# Patient Record
Sex: Female | Born: 1980 | Race: Black or African American | Hispanic: No | Marital: Single | Smoking: Current every day smoker
Health system: Southern US, Community
[De-identification: ages and names within clinical notes are randomized; demographics above are authoritative.]

## PROBLEM LIST (undated history)

## (undated) DIAGNOSIS — I10 Essential (primary) hypertension: Secondary | ICD-10-CM

## (undated) HISTORY — PX: WISDOM TOOTH EXTRACTION: SHX21

## (undated) HISTORY — PX: HAND SURGERY: SHX662

---

## 2003-12-29 ENCOUNTER — Emergency Department (HOSPITAL_COMMUNITY): Admission: EM | Admit: 2003-12-29 | Discharge: 2003-12-29 | Payer: Self-pay | Admitting: Family Medicine

## 2003-12-29 ENCOUNTER — Emergency Department (HOSPITAL_COMMUNITY): Admission: EM | Admit: 2003-12-29 | Discharge: 2003-12-29 | Payer: Self-pay | Admitting: Advanced Practice Midwife

## 2016-06-24 ENCOUNTER — Encounter: Payer: Self-pay | Admitting: Emergency Medicine

## 2016-06-24 ENCOUNTER — Emergency Department
Admission: EM | Admit: 2016-06-24 | Discharge: 2016-06-24 | Disposition: A | Discharge status: Home / Self Care | Payer: Self-pay | Attending: Emergency Medicine | Admitting: Emergency Medicine

## 2016-06-24 DIAGNOSIS — F17210 Nicotine dependence, cigarettes, uncomplicated: Secondary | ICD-10-CM | POA: Insufficient documentation

## 2016-06-24 DIAGNOSIS — K0889 Other specified disorders of teeth and supporting structures: Secondary | ICD-10-CM | POA: Insufficient documentation

## 2016-06-24 MED ORDER — MAGIC MOUTHWASH W/LIDOCAINE: 5.0000 mL | Freq: Four times a day (QID) | ORAL | 0 refills | Status: DC

## 2016-06-24 NOTE — Discharge Instructions (Signed)
OPTIONS FOR DENTAL FOLLOW UP CARE ° °[place] Department of Health and Human Services - Local Safety Net Dental Clinics °http://www.ncdhhs.gov/dph/oralhealth/services/safetynetclinics.htm °  °[facility] (336-562-3123) ° °[facility] ([phone]) ° °[facility] ([phone]) ° °[facility] ([phone]) ° °[facility] ([phone]) °This clinic caters to the indigent population and is on a lottery system. °Location: °[facility], [address], [place] °Clinic Hours: °Wednesdays from 6pm - 9pm, patients seen by a lottery system. °For dates, call or go to www.med.[facility].edu/[facility]ac/patients/Dental-[facility] °Services: °Cleanings, fillings and simple extractions. °Payment Options: °DENTAL WORK IS FREE OF CHARGE. Bring proof of income or support. °Best way to get seen: °Arrive at 5:15 pm - this is a lottery, NOT first come/first serve, so arriving earlier will not increase your chances of being seen. °  °  °[facility] °[phone] °Select option 1 for emergencies °  °Location: °[facility], [address], [place] °Clinic Hours: °No walk-ins accepted - call the day before to schedule an appointment. °Check in times are 9:30 am and 1:30 pm. °Services: °Simple extractions, temporary fillings, pulpectomy/pulp debridement, uncomplicated abscess drainage. °Payment Options: °PAYMENT IS DUE AT THE TIME OF SERVICE.  Fee is usually $100-200, additional surgical procedures (e.g. abscess drainage) may be extra. °Cash, checks, Visa/MasterCard accepted.  Can file Medicaid if patient is covered for dental - patient should call case worker to check. °No discount for [facility] patients. °Best way to get seen: °MUST call the day before and get onto the schedule. Can usually be seen the next 1-2 days. No walk-ins accepted. °  °  °[facility] °[phone] °   °Location: °[facility], [address], [place] °Clinic Hours: °M, W, Th, F 8am or 1:30pm, Tues 9a or 1:30 - first come/first served. °Services: °Simple extractions, temporary fillings, uncomplicated abscess drainage.  You do not need to be an [place] resident. °Payment Options: °PAYMENT IS DUE AT THE TIME OF SERVICE. °Dental insurance, otherwise sliding scale - bring proof of income or support. °Depending on income and treatment needed, cost is usually $50-200. °Best way to get seen: °Arrive early as it is first come/first served. °  °  °[facility] °[phone] °  °Location: °[address] °Clinic Hours: °Mon-Thu 8a-5p °Services: °Most basic dental services including extractions and fillings. °Payment Options: °PAYMENT IS DUE AT THE TIME OF SERVICE. °Sliding scale, up to 50% off - bring proof if income or support. °Medicaid with dental option accepted. °Best way to get seen: °Call to schedule an appointment, can usually be seen within 2 weeks OR they will try to see walk-ins - show up at 8a or 2p (you may have to wait). °  °  °[facility] °[phone] °[place] RESIDENTS ONLY °  °Location: °[facility], [address], [place], [place] [address] °Clinic Hours: By appointment only. °Monday - Thursday 8am-5pm, Friday 8am-12pm °Services: Cleanings, fillings, extractions. °Payment Options: °PAYMENT IS DUE AT THE TIME OF SERVICE. °Cash, Visa or MasterCard. Sliding scale - $30 minimum per service. °Best way to get seen: °Come in to office, complete packet and make an appointment - need proof of income °or support monies for each household member and proof of [place] residence. °Usually takes about a month to get in. °  °  °[facility] °[phone] °  °Location: °[address]   [place] °Clinic Hours: Walk-in Urgent Care Dental Services are offered Monday-Friday  mornings only. °The numbers of emergencies accepted daily is limited to the number of °providers available. °Maximum 15 - Mondays, Wednesdays & Thursdays °Maximum 10 - Tuesdays & Fridays °Services: °You do not need to be a [place] resident to be seen for a dental emergency. °Emergencies are defined as pain, swelling, abnormal bleeding, or dental trauma. Walkins will receive x-rays if needed. °NOTE: Dental cleaning is not an emergency. °Payment Options: °PAYMENT IS DUE AT THE TIME OF SERVICE. °Minimum co-pay is $40.00 for uninsured patients. °Minimum co-pay is $3.00 for Medicaid with dental coverage. °Dental Insurance is accepted and must be presented at time of visit. °Medicare does not cover dental. °Forms of payment: Cash, credit card, checks. °Best way to get seen: °If not previously registered with the clinic, walk-in dental registration begins at 7:15 am and is on a first come/first serve basis. °If previously registered with the clinic, call to make an appointment. °  °  °[facility] °[phone] °[place] RESIDENTS ONLY °  °Location: °[address], [place] °Clinic Hours: °Mon-Thu 10a-2p °Services: Extractions only! °Payment Options: °FREE (donations accepted) - bring proof of income or support °Best way to get seen: °Call and schedule an appointment OR come at 8am on the 1st Monday of every month (except for holidays) when it is first come/first served. °  °  °[facility] °[phone] °  °Location: °[address], [place] °Clinic Hours: °Friday mornings °Services, Payment Options, Best way to get seen: °Call for info °

## 2016-06-24 NOTE — ED Provider Notes (Signed)
[facility] Emergency Department Provider Note  ____________________________________________  Time seen: Approximately 8:30 PM  I have reviewed the triage vital signs and the nursing notes.   HISTORY  Chief Complaint Dental Pain    HPI Linda Newman is a 36 y.o. female presents emergency department complaining of dental pain to the second molar right lower dentition. Patient denies any trauma to the area. She denies any swelling, fevers or chills, difficulty breathing or swallowing. Patient reports that she has had cavities and that tooth with repair. No other complaints at this time. No medications prior to arrival.   History reviewed. No pertinent past medical history.  There are no active problems to display for this patient.   Past Surgical History:  Procedure Laterality Date  . HAND SURGERY Right   . WISDOM TOOTH EXTRACTION Bilateral     Prior to Admission medications   Medication Sig Start Date End Date Taking? Authorizing Provider  magic mouthwash w/lidocaine SOLN Take 5 mLs by mouth 4 (four) times daily. 06/24/16   Cuthriell, Delorise Royals, PA-C    Allergies Sulfa antibiotics  History reviewed. No pertinent family history.  Social History Social History  Substance Use Topics  . Smoking status: Current Every Day Smoker    Packs/day: 0.50    Types: Cigarettes  . Smokeless tobacco: Never Used  . Alcohol use No     Review of Systems  Constitutional: No fever/chills Eyes: No visual changes. No discharge ENT: No upper respiratory complaints.Positive for right lower dental pain Cardiovascular: no chest pain. Respiratory: no cough. No SOB. Gastrointestinal: No abdominal pain.  No nausea, no vomiting.   Musculoskeletal: Negative for musculoskeletal pain. Skin: Negative for rash, abrasions, lacerations, ecchymosis. Neurological: Negative for headaches, focal weakness or numbness. 10-point ROS otherwise  negative.  ____________________________________________   PHYSICAL EXAM:  VITAL SIGNS: ED Triage Vitals  Enc Vitals Group     BP 06/24/16 2027 112/69     Pulse Rate 06/24/16 2027 85     Resp 06/24/16 2027 16     Temp 06/24/16 2027 98.1 F (36.7 C)     Temp Source 06/24/16 2027 Oral     SpO2 06/24/16 2027 100 %     Weight 06/24/16 2027 134 lb (60.8 kg)     Height 06/24/16 2027 4\' 10"  (1.473 m)     Head Circumference --      Peak Flow --      Pain Score 06/24/16 2028 5     Pain Loc --      Pain Edu? --      Excl. in GC? --      Constitutional: Alert and oriented. Well appearing and in no acute distress. Eyes: Conjunctivae are normal. PERRL. EOMI. Head: Atraumatic. ENT:      Ears:       Nose: No congestion/rhinnorhea.      Mouth/Throat: Mucous membranes are moist. Cavity repair noted to the second molar right lower dentition. No surrounding erythema or edema. No tenderness to palpation of any tooth with tongue depressor. Uvula is midline. Tonsils are unremarkable bilaterally. Neck: No stridor.   Hematological/Lymphatic/Immunilogical: No cervical lymphadenopathy. Cardiovascular: Normal rate, regular rhythm. Normal S1 and S2.  Good peripheral circulation. Respiratory: Normal respiratory effort without tachypnea or retractions. Lungs CTAB. Good air entry to the bases with no decreased or absent breath sounds. Musculoskeletal: Full range of motion to all extremities. No gross deformities appreciated. Neurologic:  Normal speech and language. No gross focal neurologic deficits are appreciated.  Skin:  Skin is warm, dry and intact. No rash noted. Psychiatric: Mood and affect are normal. Speech and behavior are normal. Patient exhibits appropriate insight and judgement.   ____________________________________________   LABS (all labs ordered are listed, but only abnormal results are displayed)  Labs Reviewed - No data to  display ____________________________________________  EKG   ____________________________________________  RADIOLOGY   No results found.  ____________________________________________    PROCEDURES  Procedure(s) performed:    Procedures    Medications - No data to display   ____________________________________________   INITIAL IMPRESSION / ASSESSMENT AND PLAN / ED COURSE  Pertinent labs & imaging results that were available during my care of the patient were reviewed by me and considered in my medical decision making (see chart for details).  Review of the [place] CSRS was performed in accordance of the [facility] prior to dispensing any controlled drugs.     Patient's diagnosis is consistent with dental pain. Exam reveals no signs of infection. No visible trauma. No loose dentition.. Patient will be discharged home with prescriptions for Magic mouthwash for symptom control. The patient is given dental care options to follow-up with for further evaluation should pain continue.. Patient is to follow up with primary care as needed or otherwise directed. Patient is given ED precautions to return to the ED for any worsening or new symptoms.     ____________________________________________  FINAL CLINICAL IMPRESSION(S) / ED DIAGNOSES  Final diagnoses:  Pain, dental      NEW MEDICATIONS STARTED DURING THIS VISIT:  New Prescriptions   MAGIC MOUTHWASH W/LIDOCAINE SOLN    Take 5 mLs by mouth 4 (four) times daily.        This chart was dictated using voice recognition software/Dragon. Despite best efforts to proofread, errors can occur which can change the meaning. Any change was purely unintentional.    Racheal PatchesCuthriell, Jonathan D, PA-C 06/24/16 2048    Sharman CheekStafford, Phillip, MD 06/26/16 [phone]

## 2016-06-24 NOTE — ED Triage Notes (Signed)
Pt to ED from home c/o right tooth pain x2 days.  States trouble chewing on that side and cold drink makes it worse.

## 2017-02-06 ENCOUNTER — Telehealth: Payer: Self-pay | Admitting: Adult Health Nurse Practitioner

## 2017-02-06 NOTE — Telephone Encounter (Signed)
Wants dental services

## 2017-03-18 ENCOUNTER — Emergency Department
Admission: EM | Admit: 2017-03-18 | Discharge: 2017-03-18 | Disposition: A | Discharge status: Home / Self Care | Payer: Self-pay | Attending: Emergency Medicine | Admitting: Emergency Medicine

## 2017-03-18 ENCOUNTER — Encounter: Payer: Self-pay | Admitting: Emergency Medicine

## 2017-03-18 DIAGNOSIS — Z79899 Other long term (current) drug therapy: Secondary | ICD-10-CM | POA: Insufficient documentation

## 2017-03-18 DIAGNOSIS — F17210 Nicotine dependence, cigarettes, uncomplicated: Secondary | ICD-10-CM | POA: Insufficient documentation

## 2017-03-18 DIAGNOSIS — L02411 Cutaneous abscess of right axilla: Secondary | ICD-10-CM | POA: Insufficient documentation

## 2017-03-18 MED ORDER — TRAMADOL HCL 50 MG PO TABS: 50.0000 mg | ORAL_TABLET | Freq: Two times a day (BID) | ORAL | 0 refills | Status: DC

## 2017-03-18 MED ORDER — LIDOCAINE HCL (PF) 1 % IJ SOLN
5.0000 mL | Freq: Once | INTRAMUSCULAR | Status: AC
  Administered 2017-03-18: 5 mL
  Filled 2017-03-18: qty 5

## 2017-03-18 MED ORDER — DOXYCYCLINE HYCLATE 100 MG PO TABS: 100.0000 mg | ORAL_TABLET | Freq: Two times a day (BID) | ORAL | 0 refills | Status: DC

## 2017-03-18 MED ORDER — LIDOCAINE HCL (PF) 1 % IJ SOLN
INTRAMUSCULAR | Status: AC
  Administered 2017-03-18: 5 mL
  Filled 2017-03-18: qty 5

## 2017-03-18 MED ORDER — DOXYCYCLINE HYCLATE 100 MG PO TABS
100.0000 mg | ORAL_TABLET | Freq: Once | ORAL | Status: AC
  Administered 2017-03-18: 100 mg via ORAL
  Filled 2017-03-18: qty 1

## 2017-03-18 NOTE — ED Notes (Signed)
Pt given gown and ask to remove clothing from waist up.

## 2017-03-18 NOTE — ED Triage Notes (Signed)
Pt comes into the ED via POV c/o abscess to the right axilla.  Patient in NAD at this time and states the abscess has been there for a week.

## 2017-03-18 NOTE — ED Provider Notes (Signed)
[facility] Emergency Department Provider Note ____________________________________________  Time seen: 1729  I have reviewed the triage vital signs and the nursing notes.  HISTORY  Chief Complaint  Abscess  HPI Linda Newman is a 37 y.o. female presents to the ED for evaluation of a painful abscess to the right axilla.  Patient describes his abscess has been present for the last week.  She has increased pain, tenderness, and swelling to the area.  She denies any spontaneous drainage.  She is not taking any medications in the interim.  She reports a history of recurrent abscess formation to the armpit. She notes most abscesses resolve without intervention.   History reviewed. No pertinent past medical history.  There are no active problems to display for this patient.   Past Surgical History:  Procedure Laterality Date  . HAND SURGERY Right   . WISDOM TOOTH EXTRACTION Bilateral     Prior to Admission medications   Medication Sig Start Date End Date Taking? Authorizing Provider  doxycycline (VIBRA-TABS) 100 MG tablet Take 1 tablet (100 mg total) by mouth 2 (two) times daily. 03/18/17   Percilla Tweten, Charlesetta IvoryJenise V Bacon, PA-C  magic mouthwash w/lidocaine SOLN Take 5 mLs by mouth 4 (four) times daily. 06/24/16   Cuthriell, Delorise RoyalsJonathan D, PA-C  traMADol (ULTRAM) 50 MG tablet Take 1 tablet (50 mg total) by mouth 2 (two) times daily. 03/18/17   Avantae Bither, Charlesetta IvoryJenise V Bacon, PA-C    Allergies Sulfa antibiotics  No family history on file.  Social History Social History   Tobacco Use  . Smoking status: Current Every Day Smoker    Packs/day: 0.50    Types: Cigarettes  . Smokeless tobacco: Never Used  Substance Use Topics  . Alcohol use: No  . Drug use: No    Review of Systems  Constitutional: Negative for fever. Cardiovascular: Negative for chest pain. Respiratory: Negative for shortness of breath. Musculoskeletal: Negative for back pain. Skin: Negative for rash.   Right armpit abscess as above. Neurological: Negative for headaches, focal weakness or numbness. ____________________________________________  PHYSICAL EXAM:  VITAL SIGNS: ED Triage Vitals [03/18/17 1652]  Enc Vitals Group     BP (!) 145/95     Pulse Rate 95     Resp 16     Temp 99.1 F (37.3 C)     Temp Source Oral     SpO2 99 %     Weight 131 lb (59.4 kg)     Height 4\' 10"  (1.473 m)     Head Circumference      Peak Flow      Pain Score 7     Pain Loc      Pain Edu?      Excl. in GC?    Constitutional: Alert and oriented. Well appearing and in no distress. Head: Normocephalic and atraumatic. Hematological/Lymphatic/Immunological: No cervical lymphadenopathy. Cardiovascular: Normal rate, regular rhythm. Normal distal pulses. Respiratory: Normal respiratory effort.  Musculoskeletal: Nontender with normal range of motion in all extremities.  Neurologic:  Normal gait without ataxia. Normal speech and language. No gross focal neurologic deficits are appreciated. Skin:  Skin is warm, dry and intact. No rash noted. ____________________________________________  PROCEDURES  Doxycycline 100 mg Po  .Marland Kitchen.Incision and Drainage Date/Time: 03/18/2017 6:56 PM Performed by: Collier FlowersNelson, Robert G, Student-PA Authorized by: Lissa HoardMenshew, Aryannah Mohon V Bacon, PA-C   Consent:    Consent obtained:  Verbal   Consent given by:  Patient   Risks discussed:  Bleeding, infection, incomplete drainage and pain  Alternatives discussed:  Alternative treatment, delayed treatment and observation Location:    Type:  Abscess   Location:  Upper extremity   Upper extremity location: axilla. Pre-procedure details:    Skin preparation:  Betadine Anesthesia (see MAR for exact dosages):    Anesthesia method:  Local infiltration   Local anesthetic:  Lidocaine 1% w/o epi Procedure type:    Complexity:  Complex Procedure details:    Incision types:  Single straight   Incision depth:  Subcutaneous   Scalpel blade:   11   Wound management:  Probed and deloculated and irrigated with saline   Drainage:  Bloody   Drainage amount:  Moderate   Wound treatment:  Drain placed   Packing materials:  1/4 in iodoform gauze Post-procedure details:    Patient tolerance of procedure:  Tolerated well, no immediate complications  ____________________________________________  INITIAL IMPRESSION / ASSESSMENT AND PLAN / ED COURSE  Patient with ED evaluation of a right axilla abscess and cellulitis. 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 She is discharged with a prescription for Doxycycline and Ultram. She is also given a work note for several days. She is to follow-up with [facility] for wound check.  ____________________________________________  FINAL CLINICAL IMPRESSION(S) / ED DIAGNOSES  Final diagnoses:  Abscess of axilla, right      Lissa Hoard, PA-C 03/18/17 Pershing Cox, MD 03/18/17 1921

## 2017-03-18 NOTE — Discharge Instructions (Signed)
Keep the wound clean, dry, and covered. Take the antibiotic as directed. Apply warm compresses over the dressing to promote healing.

## 2017-03-18 NOTE — ED Notes (Signed)
First RN note:  Patient here for a boil under the right arm.  Patient in NAD at this time.

## 2018-10-16 ENCOUNTER — Emergency Department
Admission: EM | Admit: 2018-10-16 | Discharge: 2018-10-16 | Disposition: A | Discharge status: Home / Self Care | Payer: Self-pay | Attending: Emergency Medicine | Admitting: Emergency Medicine

## 2018-10-16 ENCOUNTER — Encounter: Payer: Self-pay | Admitting: Emergency Medicine

## 2018-10-16 ENCOUNTER — Emergency Department: Payer: Self-pay

## 2018-10-16 ENCOUNTER — Other Ambulatory Visit: Payer: Self-pay

## 2018-10-16 DIAGNOSIS — I10 Essential (primary) hypertension: Secondary | ICD-10-CM | POA: Insufficient documentation

## 2018-10-16 DIAGNOSIS — K0381 Cracked tooth: Secondary | ICD-10-CM | POA: Insufficient documentation

## 2018-10-16 DIAGNOSIS — R519 Headache, unspecified: Secondary | ICD-10-CM | POA: Insufficient documentation

## 2018-10-16 DIAGNOSIS — R55 Syncope and collapse: Secondary | ICD-10-CM | POA: Insufficient documentation

## 2018-10-16 DIAGNOSIS — Y929 Unspecified place or not applicable: Secondary | ICD-10-CM | POA: Insufficient documentation

## 2018-10-16 DIAGNOSIS — Y999 Unspecified external cause status: Secondary | ICD-10-CM | POA: Insufficient documentation

## 2018-10-16 DIAGNOSIS — Z79899 Other long term (current) drug therapy: Secondary | ICD-10-CM | POA: Insufficient documentation

## 2018-10-16 DIAGNOSIS — Y9389 Activity, other specified: Secondary | ICD-10-CM | POA: Insufficient documentation

## 2018-10-16 DIAGNOSIS — F17210 Nicotine dependence, cigarettes, uncomplicated: Secondary | ICD-10-CM | POA: Insufficient documentation

## 2018-10-16 DIAGNOSIS — S0083XA Contusion of other part of head, initial encounter: Secondary | ICD-10-CM | POA: Insufficient documentation

## 2018-10-16 DIAGNOSIS — S00511A Abrasion of lip, initial encounter: Secondary | ICD-10-CM | POA: Insufficient documentation

## 2018-10-16 HISTORY — DX: Essential (primary) hypertension: I10

## 2018-10-16 MED ORDER — IBUPROFEN 600 MG PO TABS
600.0000 mg | ORAL_TABLET | Freq: Once | ORAL | Status: AC
  Administered 2018-10-16: 600 mg via ORAL
  Filled 2018-10-16: qty 1

## 2018-10-16 MED ORDER — ACETAMINOPHEN 500 MG PO TABS
1000.0000 mg | ORAL_TABLET | Freq: Once | ORAL | Status: AC
  Administered 2018-10-16: 1000 mg via ORAL
  Filled 2018-10-16: qty 2
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
## 2018-10-16 NOTE — Discharge Instructions (Addendum)
CT scans were reassuring.  I do not see any obvious severe fracture of your tooth.  You might have a small chip on the tip of the tooth.  I have given you dentist follow-up to use as needed.  You can take Tylenol 1 g every 8 hours and ibuprofen 600 every 8 hours to help with pain.    IMPRESSION: 1. No acute intracranial abnormality. 2. No acute facial fracture. Mild left malar and right mandibular soft tissue swelling. 3. 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 Correlate with dental exam. 4. No acute cervical spine fracture or traumatic listhesis.  OPTIONS FOR DENTAL FOLLOW UP CARE  [place] [facility] [place] [email or web address] [phone])  [facility] [phone])  [place] [phone])  [place] [phone])  [facility] [phone]) This clinic caters to the indigent population and is on a lottery system. Location: [employer] [facility], [place], [place], [place] Clinic Hours: Wednesdays from 6pm - 9pm, patients seen by a lottery system. For dates, call or go to [email or web address] Services: Cleanings, fillings and simple extractions. Payment Options: DENTAL WORK IS FREE OF CHARGE. Bring proof of income or support. Best way to get seen: Arrive at 5:15 pm - this is a lottery, NOT first come/first serve, so arriving earlier will not increase your chances of being seen.     [facility] [phone] Select option 1 for emergencies   Location: [facility] [place], [address] [place] Clinic Hours: No walk-ins accepted - call the day before to schedule an appointment. Check in times are 9:30 am and 1:30 pm. Services: Simple extractions,  temporary fillings, pulpectomy/pulp debridement, uncomplicated abscess drainage. Payment Options: PAYMENT IS DUE AT THE TIME OF SERVICE.  Fee is usually $100-200, additional surgical procedures (e.g. abscess drainage) may be extra. Cash, checks, Visa/MasterCard accepted.  Can file Medicaid if patient is covered for dental - patient should call case worker to check. No discount for [facility] patients. Best way to get seen: MUST call the day before and get onto the schedule. Can usually be seen the next 1-2 days. No walk-ins accepted.     [place] [phone]   Location: [place], [place] Clinic Hours: M, W, Th, F 8am or 1:30pm, Tues 9a or 1:30 - first come/first served. Services: Simple extractions, temporary fillings, uncomplicated abscess drainage.  You do not need to be an [facility] resident. Payment Options: PAYMENT IS DUE AT THE TIME OF SERVICE. Dental insurance, otherwise sliding scale - bring proof of income or support. Depending on income and treatment needed, cost is usually $50-200. Best way to get seen: Arrive early as it is first come/first served.     [facility] [phone]   Location: [place] [facility] Hours: Mon-Thu 8a-5p Services: Most basic dental services including extractions and fillings. Payment Options: PAYMENT IS DUE AT THE TIME OF SERVICE. Sliding scale, up to 50% off - bring proof if income or support. Medicaid with dental option accepted. Best way to get seen: Call to schedule an appointment, can usually be seen within 2 weeks OR they will try to see walk-ins - show up at [place] or 2p (you may have to wait).     [facility]  RESIDENTS ONLY   Location: [employer], [address] [place], [place] [address] Clinic Hours: By appointment only. Monday - Thursday 8am-5pm,  Friday 8am-12pm Services: Cleanings, fillings, extractions. Payment Options: PAYMENT IS DUE AT THE TIME OF SERVICE. Cash, Visa or MasterCard. Sliding scale - $30 minimum per service. Best way to get seen: Come in to office, complete packet and make an appointment - need proof of income or support monies for each household member and proof of [facility] residence. Usually takes about a month to get in.     [facility] [phone]   Location: [address] [facility] Hours: Walk-in Urgent Care Dental Services are offered Monday-Friday mornings only. The numbers of emergencies accepted daily is limited to the number of providers available. Maximum 15 - Mondays, Wednesdays & Thursdays Maximum 10 - Tuesdays & Fridays Services: You do not need to be a [facility] resident to be seen for a dental emergency. Emergencies are defined as pain, swelling, abnormal bleeding, or dental trauma. Walkins will receive x-rays if needed. NOTE: Dental cleaning is not an emergency. Payment Options: PAYMENT IS DUE AT THE TIME OF SERVICE. Minimum co-pay is $40.00 for uninsured patients. Minimum co-pay is $3.00 for Medicaid with dental coverage. Dental Insurance is accepted and must be presented at time of visit. Medicare does not cover dental. Forms of payment: Cash, credit card, checks. Best way to get seen: If not previously registered with the clinic, walk-in dental registration begins at 7:15 am and is on a first come/first serve basis. If previously registered with the clinic, call to make an appointment.     [facility] [phone] [place] RESIDENTS ONLY   Location: [address] [place], [place] Clinic Hours: Mon-Thu 10a-2p Services: Extractions only! Payment Options: FREE (donations accepted) - bring proof of income or support Best way to get seen: Call and schedule an appointment OR come at 8am on the 1st Monday of every  month (except for holidays) when it is first come/first served.     [facility] [phone]   Location: [address] [place], [place] Clinic Hours: Friday mornings Services, Payment Options, Best way to get seen: Call for info
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
## 2019-08-18 ENCOUNTER — Telehealth: Payer: Self-pay

## 2019-08-18 ENCOUNTER — Ambulatory Visit: Payer: Self-pay

## 2019-08-18 NOTE — Telephone Encounter (Signed)
Due to staffing shortage, necessary to reschedule appt; attempted to call client-no answer @ mobile # and Mailbox full; call to home #-answered by a female & after asking for client-phone D/C'd Sharlette Dense, RN

## 2019-08-25 ENCOUNTER — Ambulatory Visit: Payer: Self-pay

## 2019-10-07 ENCOUNTER — Emergency Department
Admission: EM | Admit: 2019-10-07 | Discharge: 2019-10-07 | Disposition: A | Discharge status: Home / Self Care | Payer: Medicaid Other | Attending: Emergency Medicine | Admitting: Emergency Medicine

## 2019-10-07 ENCOUNTER — Emergency Department: Payer: Medicaid Other

## 2019-10-07 ENCOUNTER — Other Ambulatory Visit: Payer: Self-pay

## 2019-10-07 DIAGNOSIS — T24122A Burn of first degree of left knee, initial encounter: Secondary | ICD-10-CM | POA: Insufficient documentation

## 2019-10-07 DIAGNOSIS — X000XXA Exposure to flames in uncontrolled fire in building or structure, initial encounter: Secondary | ICD-10-CM | POA: Insufficient documentation

## 2019-10-07 DIAGNOSIS — T300 Burn of unspecified body region, unspecified degree: Secondary | ICD-10-CM

## 2019-10-07 DIAGNOSIS — S51811A Laceration without foreign body of right forearm, initial encounter: Secondary | ICD-10-CM | POA: Insufficient documentation

## 2019-10-07 DIAGNOSIS — S61411A Laceration without foreign body of right hand, initial encounter: Secondary | ICD-10-CM | POA: Insufficient documentation

## 2019-10-07 DIAGNOSIS — S81022A Laceration with foreign body, left knee, initial encounter: Secondary | ICD-10-CM | POA: Insufficient documentation

## 2019-10-07 DIAGNOSIS — I10 Essential (primary) hypertension: Secondary | ICD-10-CM | POA: Insufficient documentation

## 2019-10-07 DIAGNOSIS — J4521 Mild intermittent asthma with (acute) exacerbation: Secondary | ICD-10-CM | POA: Insufficient documentation

## 2019-10-07 DIAGNOSIS — S61412A Laceration without foreign body of left hand, initial encounter: Secondary | ICD-10-CM | POA: Insufficient documentation

## 2019-10-07 DIAGNOSIS — F17210 Nicotine dependence, cigarettes, uncomplicated: Secondary | ICD-10-CM | POA: Insufficient documentation

## 2019-10-07 DIAGNOSIS — T23102A Burn of first degree of left hand, unspecified site, initial encounter: Secondary | ICD-10-CM | POA: Insufficient documentation

## 2019-10-07 DIAGNOSIS — T22111A Burn of first degree of right forearm, initial encounter: Secondary | ICD-10-CM | POA: Insufficient documentation

## 2019-10-07 LAB — COMPREHENSIVE METABOLIC PANEL
ALT: 11 U/L (ref 0–44)
AST: 16 U/L (ref 15–41)
Albumin: 4 g/dL (ref 3.5–5.0)
Alkaline Phosphatase: 46 U/L (ref 38–126)
Anion gap: 10 (ref 5–15)
BUN: 14 mg/dL (ref 6–20)
CO2: 24 mmol/L (ref 22–32)
Calcium: 8.7 mg/dL — ABNORMAL LOW (ref 8.9–10.3)
Chloride: 105 mmol/L (ref 98–111)
Creatinine, Ser: 0.89 mg/dL (ref 0.44–1.00)
GFR calc non Af Amer: >60 mL/min (ref >60)
Glucose, Bld: 111 mg/dL — ABNORMAL HIGH (ref 70–99)
Potassium: 3.6 mmol/L (ref 3.5–5.1)
Sodium: 139 mmol/L (ref 135–145)
Total Bilirubin: 0.5 mg/dL (ref 0.3–1.2)
Total Protein: 7.3 g/dL (ref 6.5–8.1)

## 2019-10-07 LAB — CBC WITH DIFFERENTIAL/PLATELET
Abs Immature Granulocytes: 0.02 10*3/uL (ref 0.00–0.07)
Basophils Absolute: 0.1 10*3/uL (ref 0.0–0.1)
Basophils Relative: 1 %
Eosinophils Absolute: 0.3 10*3/uL (ref 0.0–0.5)
Eosinophils Relative: 5 %
HCT: 37.9 % (ref 36.0–46.0)
Hemoglobin: 12 g/dL (ref 12.0–15.0)
Immature Granulocytes: 0 %
Lymphocytes Relative: 41 %
Lymphs Abs: 2.1 10*3/uL (ref 0.7–4.0)
MCH: 25.5 pg — ABNORMAL LOW (ref 26.0–34.0)
MCHC: 31.7 g/dL (ref 30.0–36.0)
MCV: 80.6 fL (ref 80.0–100.0)
Monocytes Absolute: 0.6 10*3/uL (ref 0.1–1.0)
Monocytes Relative: 12 %
Neutro Abs: 2.1 10*3/uL (ref 1.7–7.7)
Neutrophils Relative %: 41 %
Platelets: 362 10*3/uL (ref 150–400)
RBC: 4.7 MIL/uL (ref 3.87–5.11)
RDW: 16 % — ABNORMAL HIGH (ref 11.5–15.5)
WBC: 5 10*3/uL (ref 4.0–10.5)
nRBC: 0 % (ref 0.0–0.2)

## 2019-10-07 LAB — COOXEMETRY PANEL
Carboxyhemoglobin: 6.4 % (ref 0.5–1.5)
Methemoglobin: 0.9 % (ref 0.0–1.5)
O2 Saturation: 94.2 %
Total oxygen content: 89.6 mL/dL

## 2019-10-07 LAB — LACTIC ACID, PLASMA: Lactic Acid, Venous: 1 mmol/L (ref 0.5–1.9)

## 2019-10-07 LAB — POCT PREGNANCY, URINE: Preg Test, Ur: NEGATIVE

## 2019-10-07 MED ORDER — LIDOCAINE-EPINEPHRINE 2 %-1:100000 IJ SOLN
20.0000 mL | Freq: Once | INTRAMUSCULAR | Status: AC
  Administered 2019-10-07: 19:00:00 20 mL
  Filled 2019-10-07: qty 1

## 2019-10-07 MED ORDER — ALBUTEROL SULFATE HFA 108 (90 BASE) MCG/ACT IN AERS: 2.0000 | INHALATION_SPRAY | RESPIRATORY_TRACT | 0 refills | Status: AC | PRN

## 2019-10-07 MED ORDER — IPRATROPIUM-ALBUTEROL 0.5-2.5 (3) MG/3ML IN SOLN
3.0000 mL | Freq: Once | RESPIRATORY_TRACT | Status: AC
  Administered 2019-10-07: 3 mL via RESPIRATORY_TRACT
  Filled 2019-10-07: qty 3

## 2019-10-07 MED ORDER — SODIUM CHLORIDE 0.9 % IV BOLUS
1000.0000 mL | Freq: Once | INTRAVENOUS | Status: AC
  Administered 2019-10-07: 16:00:00 1000 mL via INTRAVENOUS

## 2019-10-07 MED ORDER — PREDNISONE 20 MG PO TABS: 40.0000 mg | ORAL_TABLET | Freq: Every day | ORAL | 0 refills | Status: AC

## 2019-10-07 MED ORDER — CEPHALEXIN 500 MG PO CAPS: 500.0000 mg | ORAL_CAPSULE | Freq: Three times a day (TID) | ORAL | 0 refills | Status: AC

## 2019-10-07 MED ORDER — KETOROLAC TROMETHAMINE 10 MG PO TABS: 10.0000 mg | ORAL_TABLET | Freq: Four times a day (QID) | ORAL | 0 refills | Status: AC | PRN

## 2019-10-07 MED ORDER — MORPHINE SULFATE (PF) 4 MG/ML IV SOLN
4.0000 mg | Freq: Once | INTRAVENOUS | Status: AC
  Administered 2019-10-07: 16:00:00 4 mg via INTRAVENOUS
  Filled 2019-10-07: qty 1

## 2019-10-07 NOTE — ED Notes (Signed)
Negative POCT preg
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
## 2019-10-07 NOTE — ED Notes (Signed)
ED Provider at bedside. 
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
